# Patient Record
Sex: Male | Born: 2000 | Race: Black or African American | Hispanic: No | Marital: Single | Smoking: Current every day smoker
Health system: Southern US, Community
[De-identification: ages and names within clinical notes are randomized; demographics above are authoritative.]

## PROBLEM LIST (undated history)

## (undated) DIAGNOSIS — F909 Attention-deficit hyperactivity disorder, unspecified type: Secondary | ICD-10-CM

---

## 2020-03-26 ENCOUNTER — Emergency Department: Payer: No Typology Code available for payment source

## 2020-03-26 ENCOUNTER — Other Ambulatory Visit: Payer: Self-pay

## 2020-03-26 ENCOUNTER — Emergency Department
Admission: EM | Admit: 2020-03-26 | Discharge: 2020-03-26 | Disposition: A | Discharge status: Home / Self Care | Payer: No Typology Code available for payment source | Attending: Emergency Medicine | Admitting: Emergency Medicine

## 2020-03-26 DIAGNOSIS — Y92410 Unspecified street and highway as the place of occurrence of the external cause: Secondary | ICD-10-CM | POA: Diagnosis not present

## 2020-03-26 DIAGNOSIS — R519 Headache, unspecified: Secondary | ICD-10-CM | POA: Insufficient documentation

## 2020-03-26 DIAGNOSIS — S161XXA Strain of muscle, fascia and tendon at neck level, initial encounter: Secondary | ICD-10-CM | POA: Insufficient documentation

## 2020-03-26 DIAGNOSIS — R109 Unspecified abdominal pain: Secondary | ICD-10-CM | POA: Diagnosis not present

## 2020-03-26 DIAGNOSIS — F17200 Nicotine dependence, unspecified, uncomplicated: Secondary | ICD-10-CM | POA: Insufficient documentation

## 2020-03-26 DIAGNOSIS — S199XXA Unspecified injury of neck, initial encounter: Secondary | ICD-10-CM | POA: Diagnosis present

## 2020-03-26 HISTORY — DX: Attention-deficit hyperactivity disorder, unspecified type: F90.9

## 2020-03-26 MED ORDER — IBUPROFEN 800 MG PO TABS
800.0000 mg | ORAL_TABLET | Freq: Once | ORAL | Status: AC
  Administered 2020-03-26: 800 mg via ORAL
  Filled 2020-03-26: qty 1

## 2020-03-26 MED ORDER — CYCLOBENZAPRINE HCL 10 MG PO TABS: 10.0000 mg | ORAL_TABLET | Freq: Three times a day (TID) | ORAL | 0 refills | Status: AC | PRN

## 2020-03-26 MED ORDER — IOHEXOL 300 MG/ML  SOLN
100.0000 mL | Freq: Once | INTRAMUSCULAR | Status: AC | PRN
  Administered 2020-03-26: 100 mL via INTRAVENOUS

## 2020-03-26 MED ORDER — IBUPROFEN 800 MG PO TABS: 800.0000 mg | ORAL_TABLET | Freq: Three times a day (TID) | ORAL | 0 refills | Status: AC | PRN

## 2020-03-26 NOTE — ED Notes (Signed)
Patient placed in c-collar
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
